# Patient Record
Sex: Male | Born: 1960 | Race: Black or African American | Hispanic: No | Smoking: Never smoker
Health system: Southern US, Community
[De-identification: ages and names within clinical notes are randomized; demographics above are authoritative.]

---

## 2008-04-30 ENCOUNTER — Ambulatory Visit
Admit: 2008-04-30 | Disposition: A | Discharge status: Home / Self Care | Payer: Self-pay | Source: Ambulatory Visit | Admitting: Internal Medicine
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
## 2013-07-25 ENCOUNTER — Ambulatory Visit: Payer: No Typology Code available for payment source

## 2013-07-25 VITALS — BP 134/80 | HR 56 | Ht 75.0 in | Wt 248.0 lb

## 2013-07-25 DIAGNOSIS — S7001XA Contusion of right hip, initial encounter: Secondary | ICD-10-CM

## 2013-07-25 DIAGNOSIS — S39012A Strain of muscle, fascia and tendon of lower back, initial encounter: Secondary | ICD-10-CM

## 2013-07-25 MED ORDER — MELOXICAM 15 MG PO TABS
15.0000 mg | ORAL_TABLET | Freq: Every day | ORAL | Status: AC
Start: 2013-07-25 — End: 2013-08-09

## 2013-07-25 MED ORDER — CYCLOBENZAPRINE HCL 10 MG PO TABS
10.0000 mg | ORAL_TABLET | Freq: Three times a day (TID) | ORAL | Status: AC | PRN
Start: 2013-07-25 — End: 2013-08-04

## 2013-07-25 NOTE — Progress Notes (Signed)
Subjective:       Patient ID: Victor Lowe is a 53 y.o. male.    HPI    Patient presented for evaluation of work related back injury that happened at 7:30AM on July 25, 2013.  Patient is a [employer].  While walking to his truck this morning, he slipped on black ice and fell backward striking his back and right hip.  He continued to work to the end of his shift at 1:00PM.  He complain of moderated lower back pain and hip pain.  Denied radiculopathy.  Denied weakness/numbness.     Review of Systems   Constitutional: Negative.    HENT: Negative.    Eyes: Negative.    Respiratory: Negative.    Cardiovascular: Negative.    Gastrointestinal: Negative.    Genitourinary: Negative.    Musculoskeletal:        As per HPI   Skin: Negative.    Neurological: Negative.    Psychiatric/Behavioral: Negative.            Objective:    Physical Exam   Constitutional: He is oriented to person, place, and time. He appears well-developed and well-nourished. No distress.   HENT:   Head: Normocephalic and atraumatic.   Eyes: EOM are normal. Pupils are equal, round, and reactive to light.   Neck: Normal range of motion. Neck supple.   Cardiovascular: Normal rate, regular rhythm and normal heart sounds.    Pulmonary/Chest: Effort normal and breath sounds normal.   Abdominal: Soft. Bowel sounds are normal.   Musculoskeletal:        Back: Full range of motion with pain at extreme range.    Hip: Full range of motion with pain of palpation    Normal musculoskeletal exam otherwise.   Neurological: He is alert and oriented to person, place, and time. He has normal reflexes. He displays normal reflexes. No cranial nerve deficit. He exhibits normal muscle tone. Coordination normal.   Skin: Skin is warm and dry.   Psychiatric: He has a normal mood and affect. His behavior is normal. Judgment and thought content normal.           Assessment:       1. Lumbar stain  2. Right hip contusion      Plan:       1. Heat and cold to local area.  2. Pain  management   3. Limited duties

## 2013-08-01 ENCOUNTER — Ambulatory Visit: Payer: Self-pay

## 2013-08-07 ENCOUNTER — Encounter: Payer: Self-pay | Admitting: Internal Medicine

## 2013-08-07 ENCOUNTER — Ambulatory Visit
Admission: RE | Admit: 2013-08-07 | Discharge: 2013-08-07 | Disposition: A | Discharge status: Home / Self Care | Payer: Self-pay | Source: Ambulatory Visit | Attending: Internal Medicine | Admitting: Internal Medicine

## 2013-08-07 ENCOUNTER — Ambulatory Visit (INDEPENDENT_AMBULATORY_CARE_PROVIDER_SITE_OTHER): Payer: No Typology Code available for payment source | Admitting: Internal Medicine

## 2013-08-07 VITALS — BP 139/79 | HR 58 | Ht 75.0 in | Wt 236.0 lb

## 2013-08-07 DIAGNOSIS — S79929A Unspecified injury of unspecified thigh, initial encounter: Secondary | ICD-10-CM

## 2013-08-07 DIAGNOSIS — G8911 Acute pain due to trauma: Secondary | ICD-10-CM

## 2013-08-07 DIAGNOSIS — M545 Low back pain, unspecified: Secondary | ICD-10-CM

## 2013-08-07 NOTE — Progress Notes (Signed)
Subjective:       Patient ID: Victor Lowe is a 53 y.o. male.  Chief Complaint   Patient presents with   . Back Pain     Patient reports continued right sided low back pain.  He describes pin as "sharp" and isolated to his low back.  He has not been working since last visit.   . Hip Pain       HPI Comments: Pt here for follow up of low back pain going to his right hip due to a fall on 07/25/2013 on ice. States he still has low back pain going to his left hip bone( points to right  iliac crest)  Pain is dull about 3/10 worsens on bending and certain movements. Never used the Mobic and flexeril that was prescribed. No tingling or numbness in either legs. Denies any weakness.           Review of Systems   Constitutional: Negative for activity change.   Musculoskeletal: Positive for back pain. Negative for arthralgias and gait problem.        Pain over the right hip bone.   Neurological: Negative for weakness, light-headedness and numbness.           Objective:    Physical Exam   Constitutional: He appears well-developed and well-nourished. No distress.   Musculoskeletal:        Gait is normal. Spasm of the paraspinal muscles of the lumbar area Rt>Lt.  ROM back at lumbar spine reduced due to pain. SLR- Negative. Knee reflexes Normal.   Skin: He is not diaphoretic.           Assessment:       Acute lumbar sprain      Plan:       Advised Pt to start taking the medications prescribed on 07/25/2013  Light duty   Follow up on 08/12/2013

## 2013-08-12 ENCOUNTER — Ambulatory Visit: Payer: Self-pay

## 2013-08-12 ENCOUNTER — Encounter: Payer: Self-pay | Admitting: Internal Medicine

## 2013-08-12 ENCOUNTER — Ambulatory Visit (INDEPENDENT_AMBULATORY_CARE_PROVIDER_SITE_OTHER): Payer: No Typology Code available for payment source | Admitting: Internal Medicine

## 2013-08-12 VITALS — BP 142/80 | HR 50 | Ht 75.0 in | Wt 235.0 lb

## 2013-08-12 DIAGNOSIS — S79929A Unspecified injury of unspecified thigh, initial encounter: Secondary | ICD-10-CM

## 2013-08-12 DIAGNOSIS — S335XXD Sprain of ligaments of lumbar spine, subsequent encounter: Secondary | ICD-10-CM

## 2013-08-12 DIAGNOSIS — Z5189 Encounter for other specified aftercare: Secondary | ICD-10-CM

## 2013-08-12 NOTE — Progress Notes (Signed)
Subjective:       Patient ID: Victor Lowe is a 53 y.o. male.  Chief Complaint   Patient presents with   . Back Pain     Patient reports feeling "good" and denies back pain at this time.  Patient does report some tightness upon waking but he is "good" after he "loosens up."  He has not been working but feels he can return to regular duty.         Back Pain  This is a new problem. The current episode started 1 to 4 weeks ago ([place] up for low back injury on 07/25/2013). Episode frequency: Feels fine. The problem has been resolved since onset. The pain is present in the lumbar spine. Quality: Resolved. The pain does not radiate. Pertinent negatives include no leg pain, numbness or tingling. He has tried NSAIDs for the symptoms. The treatment provided significant relief.       The following portions of the patient's history were reviewed and updated as appropriate: allergies, current medications, past family history, past medical history, past social history, past surgical history and problem list.    Review of Systems   Constitutional: Negative.    HENT: Negative.    Respiratory: Negative.    Cardiovascular: Negative.    Gastrointestinal: Negative.    Musculoskeletal: Positive for back pain.   Neurological: Negative.  Negative for tingling and numbness.           Objective:    Physical Exam   Constitutional: He appears well-developed and well-nourished. No distress.   HENT:   Head: Normocephalic and atraumatic.   Musculoskeletal: Normal range of motion.        Lumbar spine- FROM no tenderness. Neurovascularly- intact.   Skin: He is not diaphoretic.           Assessment:        Resolved lumbar sprain.      Plan:        Resume full duty.

## 2020-11-27 DEATH — deceased
